# Patient Record
Sex: Female | Born: 2000 | Race: Black or African American | Hispanic: No | Marital: Single | Smoking: Never smoker
Health system: Southern US, Community
[De-identification: ages and names within clinical notes are randomized; demographics above are authoritative.]

---
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
## 2020-08-11 ENCOUNTER — Other Ambulatory Visit: Payer: Self-pay | Admitting: Student in an Organized Health Care Education/Training Program

## 2020-08-11 DIAGNOSIS — N920 Excessive and frequent menstruation with regular cycle: Secondary | ICD-10-CM

## 2020-08-11 DIAGNOSIS — R102 Pelvic and perineal pain: Secondary | ICD-10-CM

## 2020-09-24 ENCOUNTER — Other Ambulatory Visit: Payer: Self-pay | Admitting: Student in an Organized Health Care Education/Training Program

## 2020-09-24 ENCOUNTER — Other Ambulatory Visit: Payer: Self-pay

## 2020-09-24 ENCOUNTER — Ambulatory Visit
Admission: RE | Admit: 2020-09-24 | Discharge: 2020-09-24 | Disposition: A | Discharge status: Home / Self Care | Payer: Medicaid Other | Source: Ambulatory Visit | Attending: Student in an Organized Health Care Education/Training Program | Admitting: Student in an Organized Health Care Education/Training Program

## 2020-09-24 DIAGNOSIS — N920 Excessive and frequent menstruation with regular cycle: Secondary | ICD-10-CM

## 2020-09-24 DIAGNOSIS — R102 Pelvic and perineal pain: Secondary | ICD-10-CM

## 2020-10-08 ENCOUNTER — Encounter (HOSPITAL_COMMUNITY): Payer: Self-pay

## 2020-10-08 ENCOUNTER — Ambulatory Visit (HOSPITAL_COMMUNITY)
Admission: RE | Admit: 2020-10-08 | Discharge: 2020-10-08 | Disposition: A | Discharge status: Home / Self Care | Payer: Medicaid Other | Source: Ambulatory Visit | Attending: Pediatrics | Admitting: Pediatrics

## 2020-10-08 ENCOUNTER — Other Ambulatory Visit: Payer: Self-pay

## 2020-10-08 VITALS — BP 107/65 | HR 69 | Temp 98.6°F | Resp 18

## 2020-10-08 DIAGNOSIS — M21611 Bunion of right foot: Secondary | ICD-10-CM | POA: Diagnosis not present

## 2020-10-08 NOTE — ED Provider Notes (Signed)
[facility]    CSN: [id number] Arrival date & time: 10/08/20  1847      History   Chief Complaint Chief Complaint  Patient presents with   APPOINTMENT: Big Toe Injury    HPI Susan Hobbs is a 20 y.o. female.   HPI  Foot Pain: Pt reports that she has had right great toe pain for months/weeks. No known injury. She does state that the area keeps rubbing up against shoes and her other foot at times and causes some irritation. She reports no treatments. She has been wearing her wide work shoes which have helped with symptoms. No fevers, discharge or skin color changes.   History reviewed. No pertinent past medical history.  There are no problems to display for this patient.   History reviewed. No pertinent surgical history.  OB History   No obstetric history on file.      Home Medications    Prior to Admission medications   Medication Sig Start Date End Date Taking? Authorizing Provider  ibuprofen (ADVIL,MOTRIN) 400 MG tablet Take 1 tablet (400 mg total) by mouth every 6 (six) hours as needed. 08/28/16   Lowanda Foster, NP    Family History Family History  Family history unknown: Yes    Social History Social History   Tobacco Use   Smoking status: Never    Passive exposure: Yes   Smokeless tobacco: Never     Allergies   Patient has no known allergies.   Review of Systems Review of Systems  As stated above in HPI Physical Exam Triage Vital Signs ED Triage Vitals  Enc Vitals Group     BP 10/08/20 1909 107/65     Pulse Rate 10/08/20 1909 69     Resp 10/08/20 1909 18     Temp 10/08/20 1909 98.6 F (37 C)     Temp Source 10/08/20 1909 Oral     SpO2 10/08/20 1909 100 %     Weight --      Height --      Head Circumference --      Peak Flow --      Pain Score 10/08/20 1912 7     Pain Loc --      Pain Edu? --      Excl. in [facility]? --    No data found.  Updated Vital Signs BP 107/65 (BP Location: Right Arm)   Pulse 69   Temp 98.6 F (37  C) (Oral)   Resp 18   LMP 10/05/2020   SpO2 100%   Physical Exam Vitals and nursing note reviewed.  Constitutional:      General: She is not in acute distress.    Appearance: Normal appearance. She is not ill-appearing, toxic-appearing or diaphoretic.  HENT:     Head: Normocephalic and atraumatic.  Musculoskeletal:        General: Deformity (bunion of the right great foot at sign of pain) present. No swelling, tenderness or signs of injury. Normal range of motion.  Skin:    General: Skin is warm.     Coloration: Skin is not jaundiced or pale.     Findings: No bruising or erythema.  Neurological:     Mental Status: She is alert.     UC Treatments / Results  Labs (all labs ordered are listed, but only abnormal results are displayed) Labs Reviewed - No data to display  EKG   Radiology No results found.  Procedures Procedures (including critical care time)  Medications Ordered in UC Medications - No data to display  Initial Impression / Assessment and Plan / UC Course  I have reviewed the triage vital signs and the nursing notes.  Pertinent labs & imaging results that were available during my care of the patient were reviewed by me and considered in my medical decision making (see chart for details).     New. Discussion bunions along with treatments and podiatry referral if needed. Wide shoes and avoidance of high heels recommended.   Final Clinical Impressions(s) / UC Diagnoses   Final diagnoses:  None   Discharge Instructions   None    ED Prescriptions   None    PDMP not reviewed this encounter.   Susan Hobbs, [place] 10/08/20 [phone]

## 2020-10-08 NOTE — ED Triage Notes (Signed)
Pt presents with right great toe injury after slamming it on furniture a few weeks ago; pt is still having throbbing pain whenever she moves it.

## 2021-02-16 ENCOUNTER — Encounter (HOSPITAL_COMMUNITY): Payer: Self-pay

## 2021-02-16 ENCOUNTER — Emergency Department (HOSPITAL_COMMUNITY)
Admission: EM | Admit: 2021-02-16 | Discharge: 2021-02-16 | Disposition: A | Discharge status: Home / Self Care | Payer: Medicaid Other | Attending: Emergency Medicine | Admitting: Emergency Medicine

## 2021-02-16 ENCOUNTER — Other Ambulatory Visit: Payer: Self-pay

## 2021-02-16 DIAGNOSIS — R1030 Lower abdominal pain, unspecified: Secondary | ICD-10-CM | POA: Diagnosis not present

## 2021-02-16 DIAGNOSIS — N9489 Other specified conditions associated with female genital organs and menstrual cycle: Secondary | ICD-10-CM | POA: Insufficient documentation

## 2021-02-16 DIAGNOSIS — Z5321 Procedure and treatment not carried out due to patient leaving prior to being seen by health care provider: Secondary | ICD-10-CM | POA: Insufficient documentation

## 2021-02-16 LAB — COMPREHENSIVE METABOLIC PANEL
ALT: 12 U/L (ref 0–44)
AST: 21 U/L (ref 15–41)
Albumin: 4.1 g/dL (ref 3.5–5.0)
Alkaline Phosphatase: 49 U/L (ref 38–126)
Anion gap: 7 (ref 5–15)
BUN: 12 mg/dL (ref 6–20)
CO2: 22 mmol/L (ref 22–32)
Calcium: 9.4 mg/dL (ref 8.9–10.3)
Chloride: 107 mmol/L (ref 98–111)
Creatinine, Ser: 0.73 mg/dL (ref 0.44–1.00)
GFR, Estimated: >60 mL/min (ref >60)
Glucose, Bld: 112 mg/dL — ABNORMAL HIGH (ref 70–99)
Potassium: 3.7 mmol/L (ref 3.5–5.1)
Sodium: 136 mmol/L (ref 135–145)
Total Bilirubin: 0.7 mg/dL (ref 0.3–1.2)
Total Protein: 8 g/dL (ref 6.5–8.1)

## 2021-02-16 LAB — CBC
HCT: 39 % (ref 36.0–46.0)
Hemoglobin: 13 g/dL (ref 12.0–15.0)
MCH: 29.4 pg (ref 26.0–34.0)
MCHC: 33.3 g/dL (ref 30.0–36.0)
MCV: 88.2 fL (ref 80.0–100.0)
Platelets: 335 10*3/uL (ref 150–400)
RBC: 4.42 MIL/uL (ref 3.87–5.11)
RDW: 13.1 % (ref 11.5–15.5)
WBC: 3.1 10*3/uL — ABNORMAL LOW (ref 4.0–10.5)
nRBC: 0 % (ref 0.0–0.2)

## 2021-02-16 LAB — I-STAT BETA HCG BLOOD, ED (MC, WL, AP ONLY): I-stat hCG, quantitative: <5 m[IU]/mL (ref <5)

## 2021-02-16 LAB — LIPASE, BLOOD: Lipase: 25 U/L (ref 11–51)

## 2021-02-16 NOTE — ED Triage Notes (Signed)
Pt BIB EMS from home. Pt c/o sudden onset lower abdominal pain. Pt states it feels similar to period pain but she hasnt started her menstrual cycle yet.

## 2021-02-16 NOTE — ED Notes (Signed)
Pt turned in her stickers and told registration that she was leaving.

## 2022-02-19 ENCOUNTER — Ambulatory Visit (INDEPENDENT_AMBULATORY_CARE_PROVIDER_SITE_OTHER): Payer: Medicaid Other | Admitting: Physician Assistant

## 2022-02-19 ENCOUNTER — Ambulatory Visit (INDEPENDENT_AMBULATORY_CARE_PROVIDER_SITE_OTHER): Payer: Medicaid Other

## 2022-02-19 ENCOUNTER — Encounter: Payer: Self-pay | Admitting: Physician Assistant

## 2022-02-19 DIAGNOSIS — M5441 Lumbago with sciatica, right side: Secondary | ICD-10-CM

## 2022-02-19 DIAGNOSIS — M25511 Pain in right shoulder: Secondary | ICD-10-CM

## 2022-02-19 DIAGNOSIS — G8929 Other chronic pain: Secondary | ICD-10-CM

## 2022-02-19 NOTE — Progress Notes (Signed)
Office Visit Note   Patient: Susan Hobbs           Date of Birth: Apr 16, 2000           MRN: [id number] Visit Date: 02/19/2022              Requested by: Susan Schultz, FNP [address] [place],  [place] [address] PCP: Susan Schultz, FNP  Chief Complaint  Patient presents with  . Right Shoulder - Pain  . Lower Back - Pain      HPI: Susan Hobbs is a pleasant 21 year old woman who is planning on entering the [employer].  She is referred today for orthopedic clearance for some right shoulder occasional pain and very occasional right leg pain.  She denies any injuries.  With regards to the both are infrequent.  The right shoulder pain she only notices if she puts her hand behind her back and holds it there for a while.  No history of dislocation or surgeries.  She is right-handed and this does not prevent her from doing any strength activities at all.  Her right leg pain sometimes when she gets up she gets a shooting pain that goes down her leg.  No loss of bowel or bladder control no strength or neurologic deficits reported by her.  Assessment & Plan: Visit Diagnoses:  1. Chronic right shoulder pain   2. Chronic right-sided low back pain with right-sided sciatica     Plan: I do not find either item that would prevent her from entering the [employer].  She has essentially normal exams with today.  Of course if either of these recurred she could follow-up at any time.  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   Ortho Exam  Patient is alert, oriented, no adenopathy, well-dressed, normal affect, normal respiratory effort. Examination of her right shoulder she has full forward elevation internal rotation behind her back good external rotation negative apprehension sign she is neurovascularly intact in her upper extremities with good strength Right lower leg she has sensation intact 5 out of 5 strength with  resisted dorsiflexion plantarflexion of her ankles extension and flexion of her legs and flexion of her hips.  Negative straight leg raise  Imaging: XR Lumbar Spine 2-3 Views  Result Date: 02/19/2022 Radiographs of her lumbar spine were taken today.  Well-maintained alignment.  No evidence of any listhesis.  Well preserved joint space.  Very slight scoliosis to the right  XR Shoulder Right  Result Date: 02/19/2022 Radiographs of the right shoulder demonstrate humeral head well placed in the glenoid fossa.  No evidence of any subluxation or dislocation.  No evidence of any degenerative changes or fracture  No images are attached to the encounter.  Labs: Lab Results  Component Value Date   REPTSTATUS 08/29/2016 FINAL 08/28/2016   CULT MULTIPLE ORGANISMS PRESENT, NONE PREDOMINANT (A) 08/28/2016     Lab Results  Component Value Date   ALBUMIN 4.1 02/16/2021    No results found for: "MG" No results found for: "VD25OH"  No results found for: "PREALBUMIN"    Latest Ref Rng & Units 02/16/2021   12:00 PM 08/28/2016    1:54 PM  CBC EXTENDED  WBC 4.0 - 10.5 K/uL 3.1    RBC 3.87 - 5.11 MIL/uL 4.42    Hemoglobin 12.0 - 15.0 g/dL 26.8  34.1   HCT 96.2 - 46.0 % 39.0  42.0  Platelets 150 - 400 K/uL 335       There is no height or weight on file to calculate BMI.  Orders:  Orders Placed This Encounter  Procedures  . XR Shoulder Right  . XR Lumbar Spine 2-3 Views   No orders of the defined types were placed in this encounter.    Procedures: No procedures performed  Clinical Data: No additional findings.  ROS:  All other systems negative, except as noted in the HPI. Review of Systems  All other systems reviewed and are negative.  Objective: Vital Signs: There were no vitals taken for this visit.  Specialty Comments:  No specialty comments available.  PMFS History: There are no problems to display for this patient.  History reviewed. No pertinent past medical  history.  Family History  Family history unknown: Yes    History reviewed. No pertinent surgical history. Social History   Occupational History  . Not on file  Tobacco Use  . Smoking status: Never    Passive exposure: Yes  . Smokeless tobacco: Never  Substance and Sexual Activity  . Alcohol use: Not on file  . Drug use: Not on file  . Sexual activity: Not on file

## 2022-07-16 IMAGING — US US PELVIS LIMITED
1 series · 14 of 25 positions shown · non-contrast
Comparison: None.

CLINICAL DATA: Pelvic pain, menorrhagia with regular cycles. Not
sexually active. LMP 09/12/2020

EXAM:
TRANSABDOMINAL ULTRASOUND OF PELVIS
TECHNIQUE: Transabdominal ultrasound examination of the pelvis was performed
including evaluation of the uterus, ovaries, adnexal regions, and
pelvic cul-de-sac.

[Series 1: us pelvis limited · 0.16mm/px · 14 of 55 slices shown]
[im 1/55]
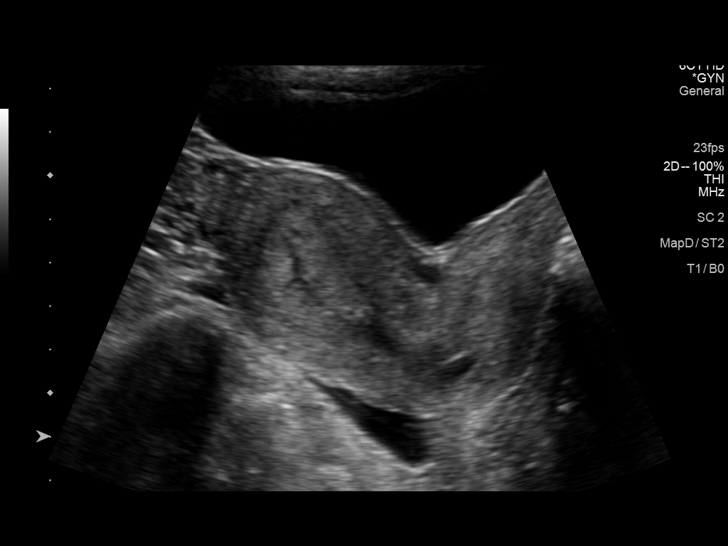
[im 5/55]
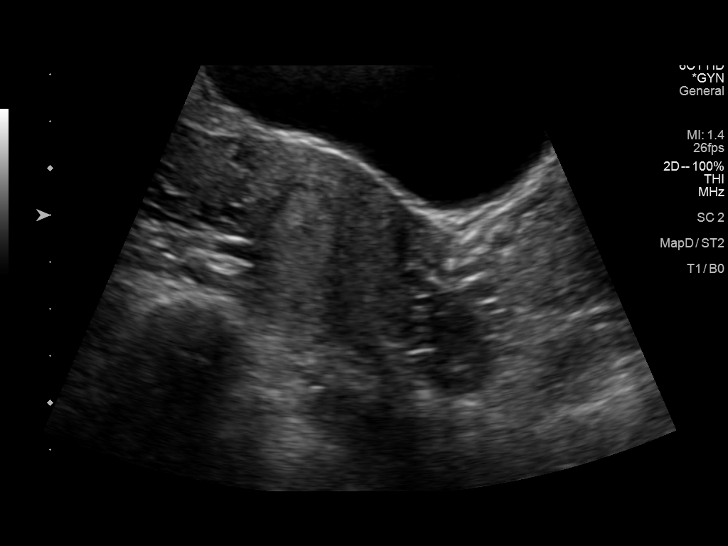
[im 10/55]
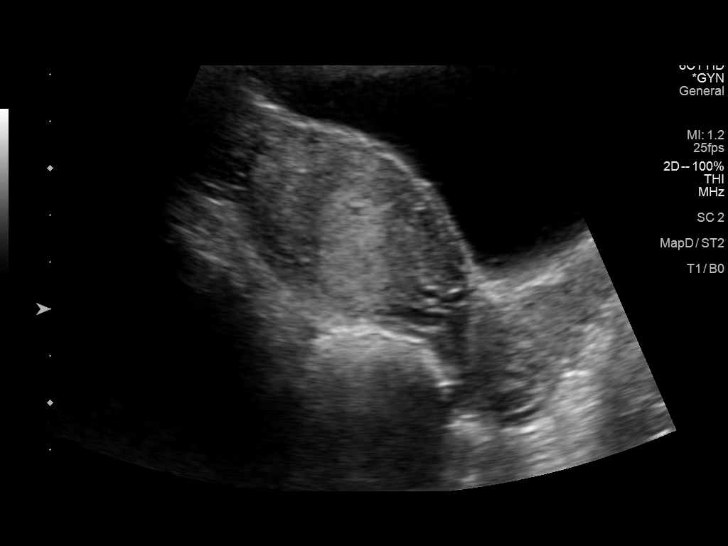
[im 14/55]
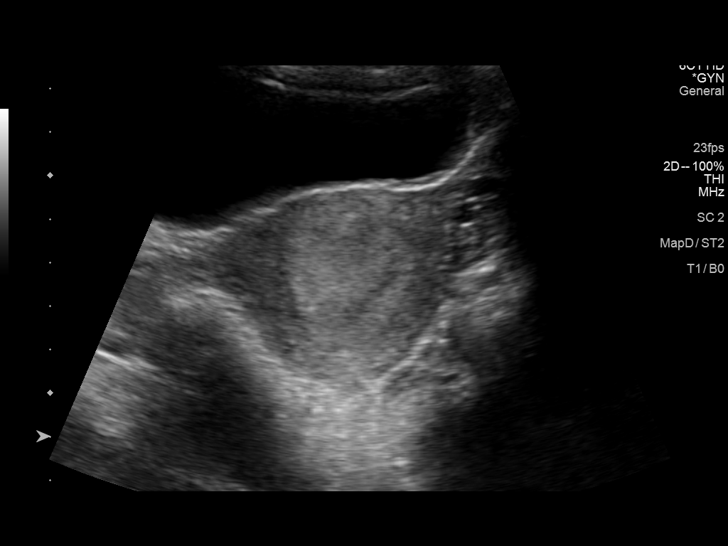
[im 19/55]
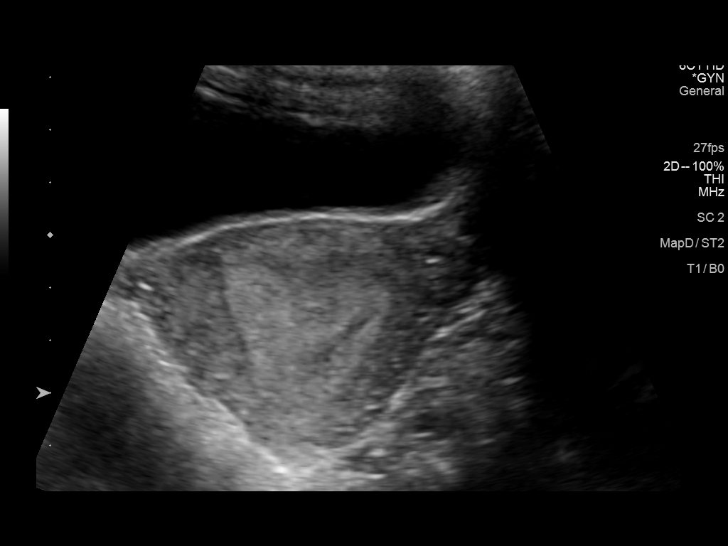
[im 21/55]
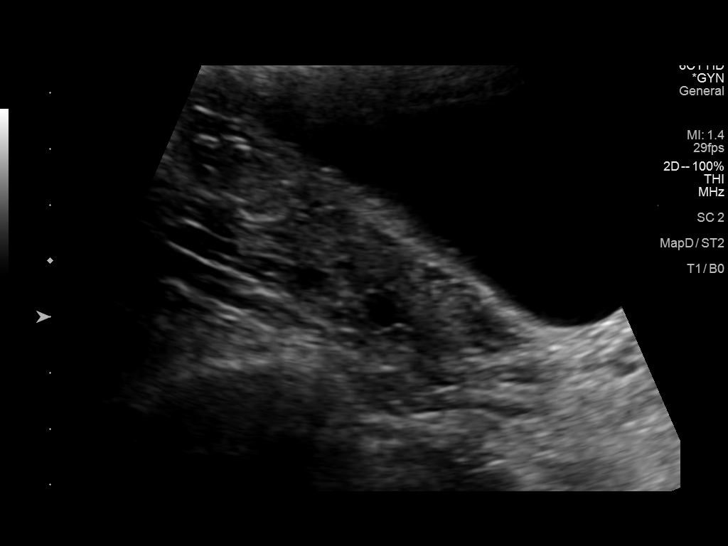
[im 25/55]
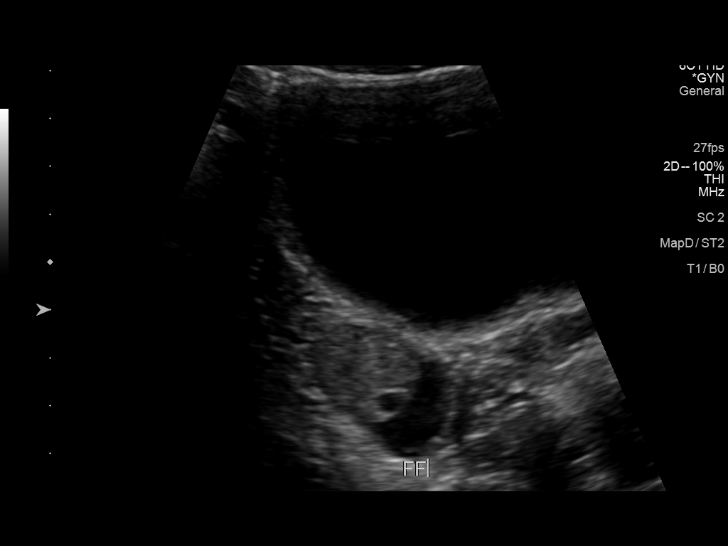
[im 30/55]
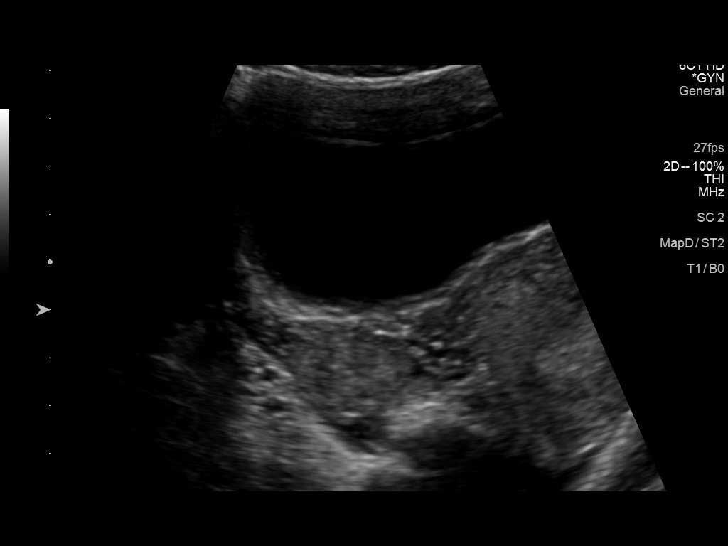
[im 34/55]
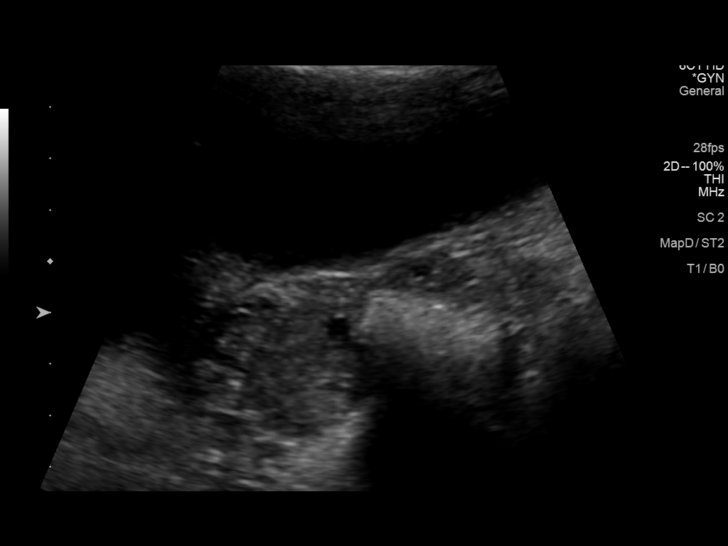
[im 37/55]
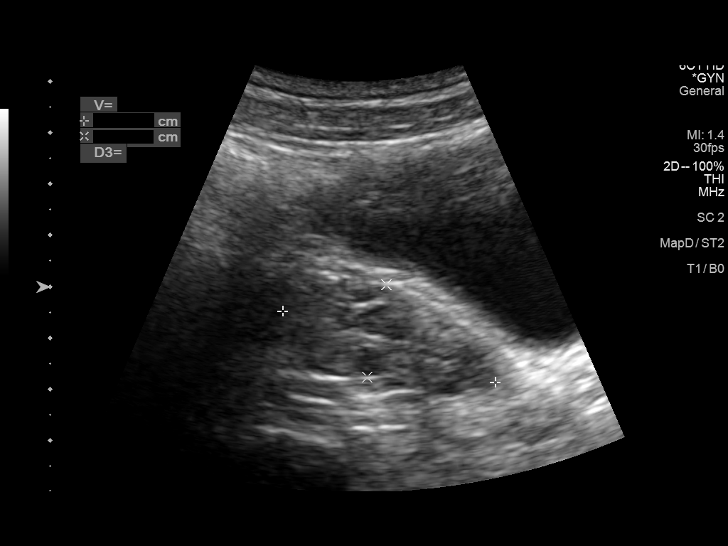
[im 41/55]
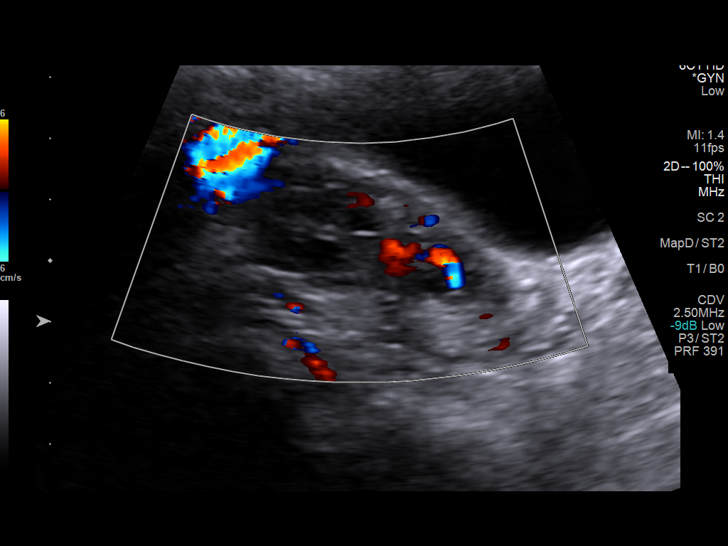
[im 46/55]
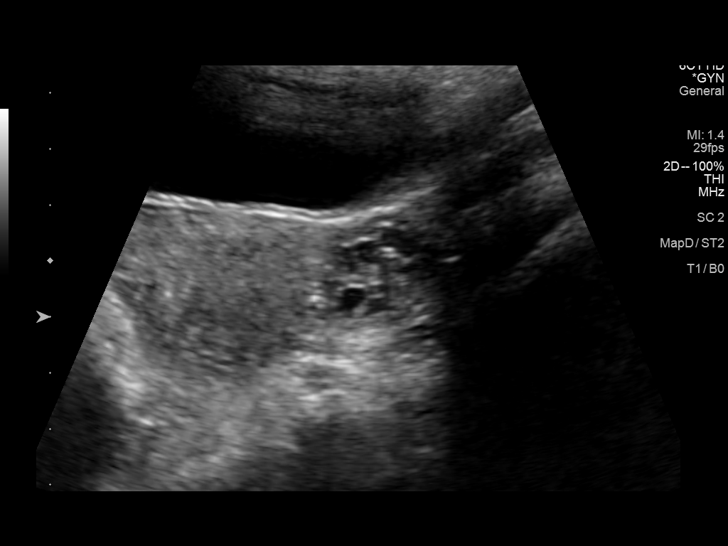
[im 50/55]
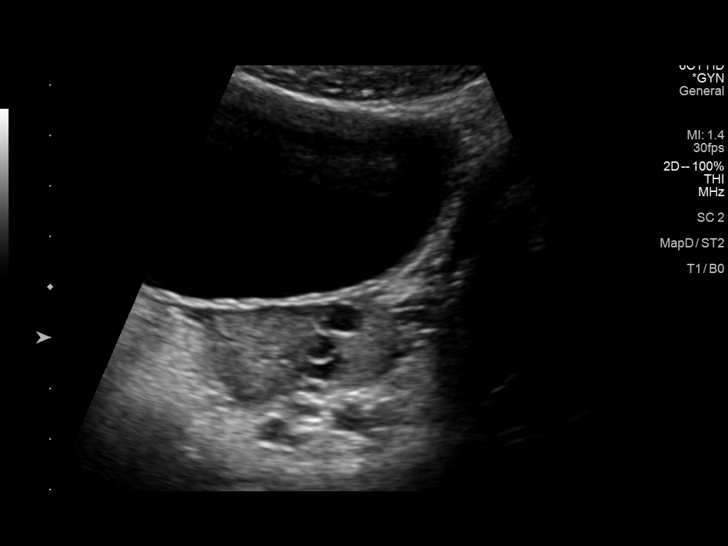
[im 55/55]
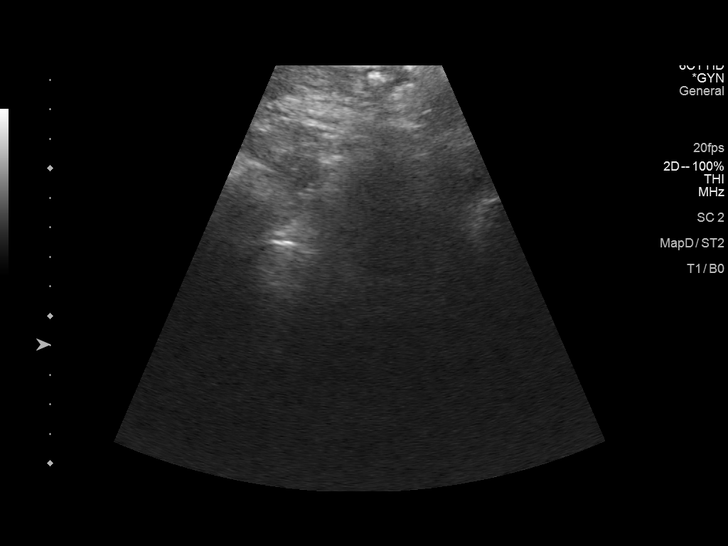

[14 of 25 positions shown; findings below may reference images not displayed]

FINDINGS: Uterus

Measurements: 7.3 x 4.1 x 4.6 cm = volume: 72 mL. No fibroids or
other mass visualized.

Endometrium

Thickness: 18 mm.  No focal abnormality visualized.

Right ovary

Measurements: 4.0 x 1.9 x 2.2 cm = volume: 9 mL. Normal
appearance/no adnexal mass.

Left ovary

Measurements: 4.4 x 1.8 x 1.8 cm = volume: 8 mL. Normal
appearance/no adnexal mass.

Other findings: Mild simple appearing free fluid is seen within the
cul-de-sac.
IMPRESSION: Mild free fluid within the cul-de-sac, possibly physiologic in a
female patient of this age. Otherwise normal pelvic sonogram.
